# Patient Record
Sex: Female | Born: 1970 | Race: White | Hispanic: No | Marital: Single | Smoking: Current every day smoker
Health system: Southern US, Community
[De-identification: ages and names within clinical notes are randomized; demographics above are authoritative.]

## PROBLEM LIST (undated history)

## (undated) DIAGNOSIS — I10 Essential (primary) hypertension: Secondary | ICD-10-CM

---

## 2014-10-27 ENCOUNTER — Emergency Department (HOSPITAL_COMMUNITY): Payer: Medicaid - Out of State

## 2014-10-27 ENCOUNTER — Encounter (HOSPITAL_COMMUNITY): Payer: Self-pay

## 2014-10-27 ENCOUNTER — Emergency Department (HOSPITAL_COMMUNITY)
Admission: EM | Admit: 2014-10-27 | Discharge: 2014-10-28 | Disposition: A | Discharge status: Home / Self Care | Payer: Medicaid - Out of State | Attending: Emergency Medicine | Admitting: Emergency Medicine

## 2014-10-27 DIAGNOSIS — Y998 Other external cause status: Secondary | ICD-10-CM | POA: Diagnosis not present

## 2014-10-27 DIAGNOSIS — Y9389 Activity, other specified: Secondary | ICD-10-CM | POA: Diagnosis not present

## 2014-10-27 DIAGNOSIS — S62312A Displaced fracture of base of third metacarpal bone, right hand, initial encounter for closed fracture: Secondary | ICD-10-CM | POA: Insufficient documentation

## 2014-10-27 DIAGNOSIS — S0181XA Laceration without foreign body of other part of head, initial encounter: Secondary | ICD-10-CM | POA: Diagnosis not present

## 2014-10-27 DIAGNOSIS — F419 Anxiety disorder, unspecified: Secondary | ICD-10-CM | POA: Diagnosis not present

## 2014-10-27 DIAGNOSIS — S60511A Abrasion of right hand, initial encounter: Secondary | ICD-10-CM | POA: Insufficient documentation

## 2014-10-27 DIAGNOSIS — Y92410 Unspecified street and highway as the place of occurrence of the external cause: Secondary | ICD-10-CM | POA: Diagnosis not present

## 2014-10-27 DIAGNOSIS — S60512A Abrasion of left hand, initial encounter: Secondary | ICD-10-CM | POA: Diagnosis not present

## 2014-10-27 DIAGNOSIS — I10 Essential (primary) hypertension: Secondary | ICD-10-CM | POA: Insufficient documentation

## 2014-10-27 DIAGNOSIS — S29001A Unspecified injury of muscle and tendon of front wall of thorax, initial encounter: Secondary | ICD-10-CM | POA: Insufficient documentation

## 2014-10-27 DIAGNOSIS — Z720 Tobacco use: Secondary | ICD-10-CM | POA: Diagnosis not present

## 2014-10-27 DIAGNOSIS — S0990XA Unspecified injury of head, initial encounter: Secondary | ICD-10-CM | POA: Insufficient documentation

## 2014-10-27 DIAGNOSIS — S62309A Unspecified fracture of unspecified metacarpal bone, initial encounter for closed fracture: Secondary | ICD-10-CM

## 2014-10-27 DIAGNOSIS — Z23 Encounter for immunization: Secondary | ICD-10-CM | POA: Diagnosis not present

## 2014-10-27 DIAGNOSIS — S61402A Unspecified open wound of left hand, initial encounter: Secondary | ICD-10-CM | POA: Diagnosis not present

## 2014-10-27 DIAGNOSIS — S61401A Unspecified open wound of right hand, initial encounter: Secondary | ICD-10-CM | POA: Diagnosis not present

## 2014-10-27 HISTORY — DX: Essential (primary) hypertension: I10

## 2014-10-27 LAB — COMPREHENSIVE METABOLIC PANEL
ALBUMIN: 3.5 g/dL (ref 3.5–5.0)
ALK PHOS: 68 U/L (ref 38–126)
ALT: 45 U/L (ref 14–54)
AST: 87 U/L — AB (ref 15–41)
Anion gap: 6 (ref 5–15)
BILIRUBIN TOTAL: 0.8 mg/dL (ref 0.3–1.2)
BUN: 15 mg/dL (ref 6–20)
CALCIUM: 8.5 mg/dL — AB (ref 8.9–10.3)
CO2: 23 mmol/L (ref 22–32)
CREATININE: 0.62 mg/dL (ref 0.44–1.00)
Chloride: 111 mmol/L (ref 101–111)
GFR calc Af Amer: >60 mL/min (ref >60)
GLUCOSE: 123 mg/dL — AB (ref 65–99)
POTASSIUM: 3.1 mmol/L — AB (ref 3.5–5.1)
Sodium: 140 mmol/L (ref 135–145)
TOTAL PROTEIN: 6.2 g/dL — AB (ref 6.5–8.1)

## 2014-10-27 LAB — CBC WITH DIFFERENTIAL/PLATELET
BASOS ABS: 0 10*3/uL (ref 0.0–0.1)
Basophils Relative: 0 %
EOS ABS: 0.1 10*3/uL (ref 0.0–0.7)
EOS PCT: 1 %
HCT: 36.5 % (ref 36.0–46.0)
Hemoglobin: 12.3 g/dL (ref 12.0–15.0)
LYMPHS PCT: 11 %
Lymphs Abs: 1.2 10*3/uL (ref 0.7–4.0)
MCH: 28.2 pg (ref 26.0–34.0)
MCHC: 33.7 g/dL (ref 30.0–36.0)
MCV: 83.7 fL (ref 78.0–100.0)
MONO ABS: 0.6 10*3/uL (ref 0.1–1.0)
Monocytes Relative: 5 %
Neutro Abs: 8.8 10*3/uL — ABNORMAL HIGH (ref 1.7–7.7)
Neutrophils Relative %: 83 %
PLATELETS: 336 10*3/uL (ref 150–400)
RBC: 4.36 MIL/uL (ref 3.87–5.11)
RDW: 13.4 % (ref 11.5–15.5)
WBC: 10.7 10*3/uL — AB (ref 4.0–10.5)

## 2014-10-27 LAB — SAMPLE TO BLOOD BANK

## 2014-10-27 LAB — ETHANOL: Alcohol, Ethyl (B): 7 mg/dL — ABNORMAL HIGH (ref <5)

## 2014-10-27 MED ORDER — HYDROMORPHONE HCL 1 MG/ML IJ SOLN
0.5000 mg | Freq: Once | INTRAMUSCULAR | Status: AC
  Administered 2014-10-27: 0.5 mg via INTRAVENOUS
  Filled 2014-10-27: qty 1

## 2014-10-27 MED ORDER — CEFAZOLIN SODIUM 1-5 GM-% IV SOLN
1.0000 g | Freq: Once | INTRAVENOUS | Status: AC
  Administered 2014-10-27: 1 g via INTRAVENOUS
  Filled 2014-10-27: qty 50

## 2014-10-27 MED ORDER — SODIUM CHLORIDE 0.9 % IV BOLUS (SEPSIS)
1000.0000 mL | Freq: Once | INTRAVENOUS | Status: AC
  Administered 2014-10-27: 1000 mL via INTRAVENOUS

## 2014-10-27 MED ORDER — TETANUS-DIPHTH-ACELL PERTUSSIS 5-2.5-18.5 LF-MCG/0.5 IM SUSP
0.5000 mL | Freq: Once | INTRAMUSCULAR | Status: AC
  Administered 2014-10-27: 0.5 mL via INTRAMUSCULAR
  Filled 2014-10-27: qty 0.5

## 2014-10-27 MED ORDER — LIDOCAINE-EPINEPHRINE 2 %-1:100000 IJ SOLN
20.0000 mL | Freq: Once | INTRAMUSCULAR | Status: AC
  Administered 2014-10-27: 20 mL
  Filled 2014-10-27: qty 20
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
## 2014-10-28 MED ORDER — CEPHALEXIN 500 MG PO CAPS: 500.0000 mg | ORAL_CAPSULE | Freq: Three times a day (TID) | ORAL | Status: AC

## 2014-10-28 MED ORDER — OXYCODONE-ACETAMINOPHEN 5-325 MG PO TABS: 1.0000 | ORAL_TABLET | Freq: Four times a day (QID) | ORAL | Status: AC | PRN

## 2014-10-28 MED ORDER — ONDANSETRON 4 MG PO TBDP
4.0000 mg | ORAL_TABLET | Freq: Once | ORAL | Status: AC
  Administered 2014-10-28: 4 mg via ORAL
  Filled 2014-10-28: qty 1

## 2014-10-28 MED ORDER — OXYCODONE-ACETAMINOPHEN 5-325 MG PO TABS
2.0000 | ORAL_TABLET | Freq: Once | ORAL | Status: AC
  Administered 2014-10-28: 2 via ORAL
  Filled 2014-10-28: qty 2

## 2014-10-28 MED ORDER — IBUPROFEN 600 MG PO TABS
600.0000 mg | ORAL_TABLET | Freq: Four times a day (QID) | ORAL | Status: AC | PRN
Start: 2014-10-28 — End: ?

## 2014-10-28 MED ORDER — CYCLOBENZAPRINE HCL 10 MG PO TABS
5.0000 mg | ORAL_TABLET | Freq: Two times a day (BID) | ORAL | Status: AC | PRN
Start: 2014-10-28 — End: ?

## 2014-10-28 NOTE — Discharge Instructions (Signed)
Clean forehead wounds twice daily with half strength peroxide and apply antibiotic ointment.  Watch for signs of infection including fever, purulent drainage, and worsening redness.  Sutures should be removed in one week.         Cast or Splint Care Casts and splints support injured limbs and keep bones from moving while they heal. It is important to care for your cast or splint at home.  HOME CARE INSTRUCTIONS  Keep the cast or splint uncovered during the drying period. It can take 24 to 48 hours to dry if it is made of plaster. A fiberglass cast will dry in less than 1 hour.  Do not rest the cast on anything harder than a pillow for the first 24 hours.  Do not put weight on your injured limb or apply pressure to the cast until your health care provider gives you permission.  Keep the cast or splint dry. Wet casts or splints can lose their shape and may not support the limb as well. A wet cast that has lost its shape can also create harmful pressure on your skin when it dries. Also, wet skin can become infected.  Cover the cast or splint with a plastic bag when bathing or when out in the rain or snow. If the cast is on the trunk of the body, take sponge baths until the cast is removed.  If your cast does become wet, dry it with a towel or a blow dryer on the cool setting only.  Keep your cast or splint clean. Soiled casts may be wiped with a moistened cloth.  Do not place any hard or soft foreign objects under your cast or splint, such as cotton, toilet paper, lotion, or powder.  Do not try to scratch the skin under the cast with any object. The object could get stuck inside the cast. Also, scratching could lead to an infection. If itching is a problem, use a blow dryer on a cool setting to relieve discomfort.  Do not trim or cut your cast or remove padding from inside of it.  Exercise all joints next to the injury that are not immobilized by the cast or splint. For example, if you  have a long leg cast, exercise the hip joint and toes. If you have an arm cast or splint, exercise the shoulder, elbow, thumb, and fingers.  Elevate your injured arm or leg on 1 or 2 pillows for the first 1 to 3 days to decrease swelling and pain.It is best if you can comfortably elevate your cast so it is higher than your heart. SEEK MEDICAL CARE IF:   Your cast or splint cracks.  Your cast or splint is too tight or too loose.  You have unbearable itching inside the cast.  Your cast becomes wet or develops a soft spot or area.  You have a bad smell coming from inside your cast.  You get an object stuck under your cast.  Your skin around the cast becomes red or raw.  You have new pain or worsening pain after the cast has been applied. SEEK IMMEDIATE MEDICAL CARE IF:   You have fluid leaking through the cast.  You are unable to move your fingers or toes.  You have discolored (blue or white), cool, painful, or very swollen fingers or toes beyond the cast.  You have tingling or numbness around the injured area.  You have severe pain or pressure under the cast.  You have any difficulty with  your breathing or have shortness of breath.  You have chest pain.   This information is not intended to replace advice given to you by your health care provider. Make sure you discuss any questions you have with your health care provider.   Document Released: 12/20/1999 Document Revised: 10/12/2012 Document Reviewed: 06/30/2012 Elsevier Interactive Patient Education 2016 [employer]  Facial Laceration  A facial laceration is a cut on the face. These injuries can be painful and cause bleeding. Lacerations usually heal quickly, but they need special care to reduce scarring. DIAGNOSIS  Your health care provider will take a medical history, ask for details about how the injury occurred, and examine the wound to determine how deep the cut is. TREATMENT  Some facial lacerations may not  require closure. Others may not be able to be closed because of an increased risk of infection. The risk of infection and the chance for successful closure will depend on various factors, including the amount of time since the injury occurred. The wound may be cleaned to help prevent infection. If closure is appropriate, pain medicines may be given if needed. Your health care provider will use stitches (sutures), wound glue (adhesive), or skin adhesive strips to repair the laceration. These tools bring the skin edges together to allow for faster healing and a better cosmetic outcome. If needed, you may also be given a tetanus shot. HOME CARE INSTRUCTIONS  Only take over-the-counter or prescription medicines as directed by your health care provider.  Follow your health care provider's instructions for wound care. These instructions will vary depending on the technique used for closing the wound. For Sutures:  Keep the wound clean and dry.   If you were given a bandage (dressing), you should change it at least once a day. Also change the dressing if it becomes wet or dirty, or as directed by your health care provider.   Wash the wound with soap and water 2 times a day. Rinse the wound off with water to remove all soap. Pat the wound dry with a clean towel.   After cleaning, apply a thin layer of the antibiotic ointment recommended by your health care provider. This will help prevent infection and keep the dressing from sticking.   You may shower as usual after the first 24 hours. Do not soak the wound in water until the sutures are removed.   Get your sutures removed as directed by your health care provider. With facial lacerations, sutures should usually be taken out after 4-5 days to avoid stitch marks.   Wait a few days after your sutures are removed before applying any makeup. For Skin Adhesive Strips:  Keep the wound clean and dry.   Do not get the skin adhesive strips wet. You  may bathe carefully, using caution to keep the wound dry.   If the wound gets wet, pat it dry with a clean towel.   Skin adhesive strips will fall off on their own. You may trim the strips as the wound heals. Do not remove skin adhesive strips that are still stuck to the wound. They will fall off in time.  For Wound Adhesive:  You may briefly wet your wound in the shower or bath. Do not soak or scrub the wound. Do not swim. Avoid periods of heavy sweating until the skin adhesive has fallen off on its own. After showering or bathing, gently pat the wound dry with a clean towel.   Do not apply liquid medicine, cream  medicine, ointment medicine, or makeup to your wound while the skin adhesive is in place. This may loosen the film before your wound is healed.   If a dressing is placed over the wound, be careful not to apply tape directly over the skin adhesive. This may cause the adhesive to be pulled off before the wound is healed.   Avoid prolonged exposure to sunlight or tanning lamps while the skin adhesive is in place.  The skin adhesive will usually remain in place for 5-10 days, then naturally fall off the skin. Do not pick at the adhesive film.  After Healing: Once the wound has healed, cover the wound with sunscreen during the day for 1 full year. This can help minimize scarring. Exposure to ultraviolet light in the first year will darken the scar. It can take 1-2 years for the scar to lose its redness and to heal completely.  SEEK MEDICAL CARE IF:  You have a fever. SEEK IMMEDIATE MEDICAL CARE IF:  You have redness, pain, or swelling around the wound.   You see ayellowish-white fluid (pus) coming from the wound.    This information is not intended to replace advice given to you by your health care provider. Make sure you discuss any questions you have with your health care provider.   Document Released: 01/30/2004 Document Revised: 01/12/2014 Document Reviewed:  08/04/2012 Elsevier Interactive Patient Education 2016 [employer]  Metacarpal Fracture A metacarpal fracture is a break (fracture) of a bone in the hand. Metacarpals are the bones that extend from your knuckles to your wrist. In each hand, you have five metacarpal bones that connect your fingers and your thumb to your wrist. Some hand fractures have bone pieces that are close together and stable (simple). These fractures may be treated with only a splint or cast. Hand fractures that have many pieces of broken bone (comminuted), unstable bone pieces (displaced), or a bone that breaks through the skin (compound) usually require surgery. CAUSES This injury may be caused by:  A fall.  A hard, direct hit to your hand.  An injury that squeezes your knuckle, stretches your finger out of place, or crushes your hand. RISK FACTORS This injury is more likely to occur if:  You play contact sports.  You have certain bone diseases. SYMPTOMS  Symptoms of this type of fracture develop soon after the injury. Symptoms may include:  Swelling.  Pain.  Stiffness.  Increased pain with movement.  Bruising.  Inability to move a finger.  A shortened finger.  A finger knuckle that looks sunken in.  Unusual appearance of the hand or finger (deformity). DIAGNOSIS  This injury may be diagnosed based on your signs and symptoms, especially if you had a recent hand injury. Your health care provider will perform a physical exam. He or she may also order X-rays to confirm the diagnosis.  TREATMENT  Treatment for this injury depends on the type of fracture you have and how severe it is. Possible treatments include:  Non-reduction. This can be done if the bone does not need to be moved back into place. The fracture can be casted or splinted as it is.   Closed reduction. If your bone is stable and can be moved back into place, you may only need to wear a cast or splint or have buddy taping.  Closed  reduction with internal fixation (CRIF). This is the most common treatment. You may have this procedure if your bone can be moved back into place  but needs more support. Wires, pins, or screws may be inserted through your skin to stabilize the fracture.  Open reduction with internal fixation (ORIF). This may be needed if your fracture is severe and unstable. It involves surgery to move your bone back into the right position. Screws, wires, or plates are used to stabilize the fracture. After all procedures, you may need to wear a cast or a splint for several weeks. You will also need to have follow-up X-rays to make sure that the bone is healing well and staying in position. After you no longer need your cast or splint, you may need physical therapy. This will help you to regain full movement and strength in your hand.  HOME CARE INSTRUCTIONS  If You Have a Cast:  Do not stick anything inside the cast to scratch your skin. Doing that increases your risk of infection.  Check the skin around the cast every day. Report any concerns to your health care provider. You may put lotion on dry skin around the edges of the cast. Do not apply lotion to the skin underneath the cast. If You Have a Splint:  Wear it as directed by your health care provider. Remove it only as directed by your health care provider.  Loosen the splint if your fingers become numb and tingle, or if they turn cold and blue. Bathing  Cover the cast or splint with a watertight plastic bag to protect it from water while you take a bath or a shower. Do not let the cast or splint get wet. Managing Pain, Stiffness, and Swelling  If directed, apply ice to the injured area (if you have a splint, not a cast):  Put ice in a plastic bag.  Place a towel between your skin and the bag.  Leave the ice on for 20 minutes, 2-3 times a day.  Move your fingers often to avoid stiffness and to lessen swelling.  Raise the injured area above the  level of your heart while you are sitting or lying down. Driving  Do not drive or operate heavy machinery while taking pain medicine.  Do not drive while wearing a cast or splint on a hand that you use for driving. Activity  Return to your normal activities as directed by your health care provider. Ask your health care provider what activities are safe for you. General Instructions  Do not put pressure on any part of the cast or splint until it is fully hardened. This may take several hours.  Keep the cast or splint clean and dry.  Do not use any tobacco products, including cigarettes, chewing tobacco, or electronic cigarettes. Tobacco can delay bone healing. If you need help quitting, ask your health care provider.  Take medicines only as directed by your health care provider.  Keep all follow-up visits as directed by your health care provider. This is important. SEEK MEDICAL CARE IF:   Your pain is getting worse.  You have redness, swelling, or pain in the injured area.   You have fluid, blood, or pus coming from under your cast or splint.   You notice a bad smell coming from under your cast or splint.   You have a fever.  SEEK IMMEDIATE MEDICAL CARE IF:   You develop a rash.   You have trouble breathing.   Your skin or nails on your injured hand turn blue or gray even after you loosen your splint.  Your injured hand feels cold or becomes numb even  after you loosen your splint.   You develop severe pain under the cast or in your hand.   This information is not intended to replace advice given to you by your health care provider. Make sure you discuss any questions you have with your health care provider.   Document Released: 12/22/2004 Document Revised: 09/12/2014 Document Reviewed: 10/11/2013 Else[employer] Interactive Patient Education [employer]

## 2014-10-28 NOTE — ED Notes (Signed)
Pt stable, ambulatory, states understanding of discharge instructions 
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
## 2014-10-28 NOTE — Progress Notes (Signed)
Orthopedic Tech Progress Note Patient Details:  Kelli AddisonShasta Rice 01/12/1970 [id number]  Ortho Devices Type of Ortho Device: Ace wrap, Volar splint Ortho Device/Splint Location: RUE Ortho Device/Splint Interventions: Application   Asia R Thompson 10/28/2014, 1:36 AM

## 2014-10-28 NOTE — Procedures (Signed)
Preop diagnosis: Right forehead/brow lacerations Postop diagnosis: same Procedure: Complex closure of right forehead/brow lacerations totaling 11 cm Surgeon: Jenne PaneBates Anesth: Local Compl: None Findings: Two lacerations of right forehead/brow.  Smaller laceration in right forehead measures 3 cm and extends through subcutaneous tissues and is curved.  There was arterial bleeding uncovered during irrigation that was controlled with ligation.  Larger laceration in right forehead/brow measures 8 cm and extends down to the bone near the superior orbital rim.  There was quite a bit of grit and gravel in this wound that was washed out extensively. Description: After discussing risks, benefits, and alternatives, the patient was positioned in a nearly supine position.  The right forehead and periorbital area was prepped with Betadyne and draped in sterile fashion.  The wounds were then injected with 2% lidocaine with 1:200,000 epinephrine totaling about 9 cc.  The smaller wound was copious irrigated with saline.  The lateral wound had an active bleed that was controlled with 3-0 silk ligation.  The wound was closed in the subcutaneous layer with 4-0 Vicryl in a simple, interrupted fashion and the skin was closed with 5-0 Prolene in a simple, interrupted fashion.  The larger wound was then copiously irrigated with saline including careful removal of grit and gravel of any significant size.  The tissues had a sandy appearance in areas after irrigation that could not be removed.  The wound was then closed in the muscular and subcutaneous layer with 4-0 Vicryl in a simple, interrupted fashion.  The skin was closed with 5-0 Prolene in a simple, running fashion.  The patient was then cleaned off and Bacitracin was added to the wounds.  She was returned to nursing care in stable condition.

## 2014-10-28 NOTE — ED Notes (Signed)
Pt ambulated with one assist in hall
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
